# Patient Record
Sex: Female | Born: 1960 | Race: White | Hispanic: No | Marital: Married
Health system: Southern US, Community
[De-identification: ages and names within clinical notes are randomized; demographics above are authoritative.]

---

## 2006-05-17 ENCOUNTER — Encounter: Admission: RE | Admit: 2006-05-17 | Discharge: 2006-05-17 | Payer: Self-pay | Admitting: *Deleted

## 2006-07-12 ENCOUNTER — Ambulatory Visit: Payer: Self-pay | Admitting: Cardiology

## 2007-07-16 ENCOUNTER — Encounter: Admission: RE | Admit: 2007-07-16 | Discharge: 2007-07-16 | Payer: Self-pay | Admitting: Family Medicine

## 2009-06-29 ENCOUNTER — Encounter: Admission: RE | Admit: 2009-06-29 | Discharge: 2009-06-29 | Payer: Self-pay | Admitting: Family Medicine

## 2010-07-31 ENCOUNTER — Other Ambulatory Visit: Payer: Self-pay | Admitting: Family Medicine

## 2010-07-31 DIAGNOSIS — Z1231 Encounter for screening mammogram for malignant neoplasm of breast: Secondary | ICD-10-CM

## 2010-08-25 ENCOUNTER — Ambulatory Visit
Admission: RE | Admit: 2010-08-25 | Discharge: 2010-08-25 | Disposition: A | Discharge status: Home / Self Care | Payer: BC Managed Care – PPO | Source: Ambulatory Visit | Attending: Family Medicine | Admitting: Family Medicine

## 2010-08-25 DIAGNOSIS — Z1231 Encounter for screening mammogram for malignant neoplasm of breast: Secondary | ICD-10-CM

## 2011-12-24 ENCOUNTER — Other Ambulatory Visit: Payer: Self-pay | Admitting: Family Medicine

## 2011-12-24 DIAGNOSIS — Z1231 Encounter for screening mammogram for malignant neoplasm of breast: Secondary | ICD-10-CM

## 2012-01-28 ENCOUNTER — Ambulatory Visit
Admission: RE | Admit: 2012-01-28 | Discharge: 2012-01-28 | Disposition: A | Discharge status: Home / Self Care | Payer: BC Managed Care – PPO | Source: Ambulatory Visit | Attending: Family Medicine | Admitting: Family Medicine

## 2012-01-28 DIAGNOSIS — Z1231 Encounter for screening mammogram for malignant neoplasm of breast: Secondary | ICD-10-CM

## 2012-02-01 ENCOUNTER — Other Ambulatory Visit: Payer: Self-pay | Admitting: Family Medicine

## 2012-02-01 DIAGNOSIS — R928 Other abnormal and inconclusive findings on diagnostic imaging of breast: Secondary | ICD-10-CM

## 2012-02-11 ENCOUNTER — Ambulatory Visit
Admission: RE | Admit: 2012-02-11 | Discharge: 2012-02-11 | Disposition: A | Discharge status: Home / Self Care | Payer: BC Managed Care – PPO | Source: Ambulatory Visit | Attending: Family Medicine | Admitting: Family Medicine

## 2012-02-11 DIAGNOSIS — R928 Other abnormal and inconclusive findings on diagnostic imaging of breast: Secondary | ICD-10-CM

## 2012-03-20 ENCOUNTER — Other Ambulatory Visit: Payer: Self-pay | Admitting: Family Medicine

## 2012-03-20 DIAGNOSIS — N6320 Unspecified lump in the left breast, unspecified quadrant: Secondary | ICD-10-CM

## 2012-03-28 ENCOUNTER — Ambulatory Visit
Admission: RE | Admit: 2012-03-28 | Discharge: 2012-03-28 | Disposition: A | Discharge status: Home / Self Care | Payer: BC Managed Care – PPO | Source: Ambulatory Visit | Attending: Family Medicine | Admitting: Family Medicine

## 2012-03-28 DIAGNOSIS — N6320 Unspecified lump in the left breast, unspecified quadrant: Secondary | ICD-10-CM

## 2012-03-28 HISTORY — PX: BREAST BIOPSY: SHX20

## 2014-01-12 ENCOUNTER — Other Ambulatory Visit: Payer: Self-pay | Admitting: Family Medicine

## 2014-01-12 DIAGNOSIS — N63 Unspecified lump in unspecified breast: Secondary | ICD-10-CM

## 2014-01-27 ENCOUNTER — Ambulatory Visit
Admission: RE | Admit: 2014-01-27 | Discharge: 2014-01-27 | Disposition: A | Discharge status: Home / Self Care | Payer: BC Managed Care – PPO | Source: Ambulatory Visit | Attending: Family Medicine | Admitting: Family Medicine

## 2014-01-27 ENCOUNTER — Other Ambulatory Visit: Payer: Self-pay | Admitting: Family Medicine

## 2014-01-27 DIAGNOSIS — N63 Unspecified lump in unspecified breast: Secondary | ICD-10-CM

## 2014-01-27 DIAGNOSIS — R921 Mammographic calcification found on diagnostic imaging of breast: Secondary | ICD-10-CM

## 2014-02-05 ENCOUNTER — Ambulatory Visit
Admission: RE | Admit: 2014-02-05 | Discharge: 2014-02-05 | Disposition: A | Discharge status: Home / Self Care | Payer: BC Managed Care – PPO | Source: Ambulatory Visit | Attending: Family Medicine | Admitting: Family Medicine

## 2014-02-05 DIAGNOSIS — R921 Mammographic calcification found on diagnostic imaging of breast: Secondary | ICD-10-CM

## 2014-02-05 HISTORY — PX: BREAST BIOPSY: SHX20

## 2014-10-27 IMAGING — US US BREAST*L*
1 series · 13 of 15 positions shown · non-contrast
Comparison: 01/28/2012, 08/25/2010, 06/29/2009.

CLINICAL DATA: Recall from screening mammography.

DIGITAL DIAGNOSTIC LEFT BREAST MAMMOGRAM  AND LEFT BREAST
ULTRASOUND:

[Series 1: us breast*left* · 13 of 15 slices shown]
[im 1/15]
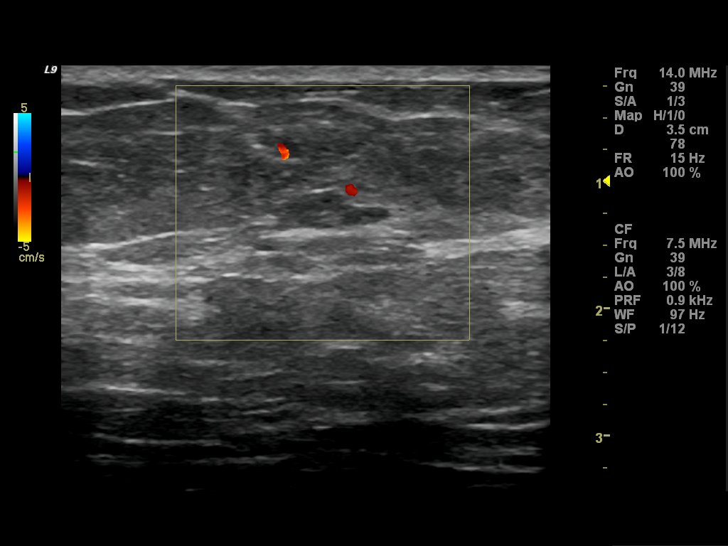
[im 2/15]
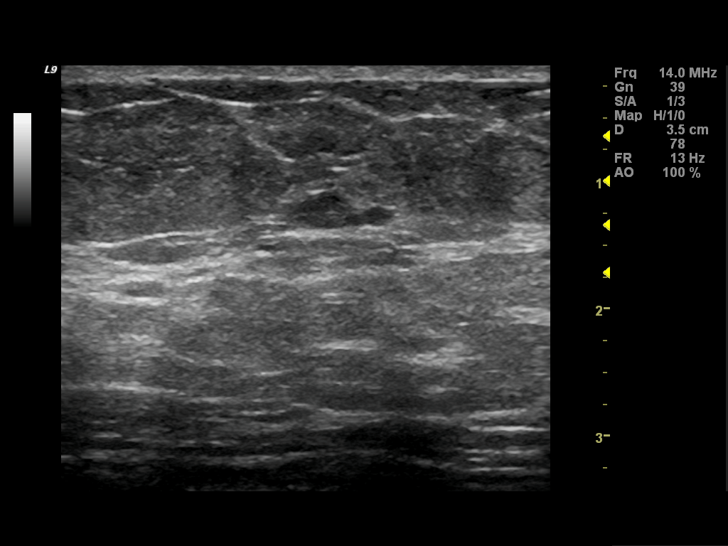
[im 3/15]
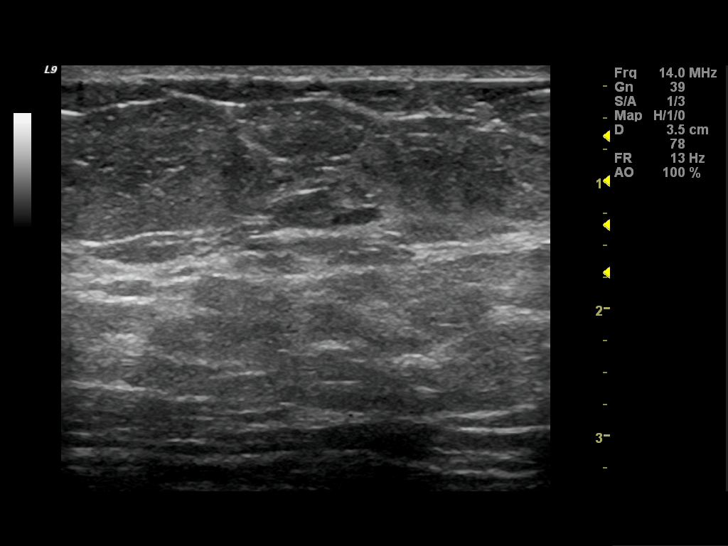
[im 5/15]
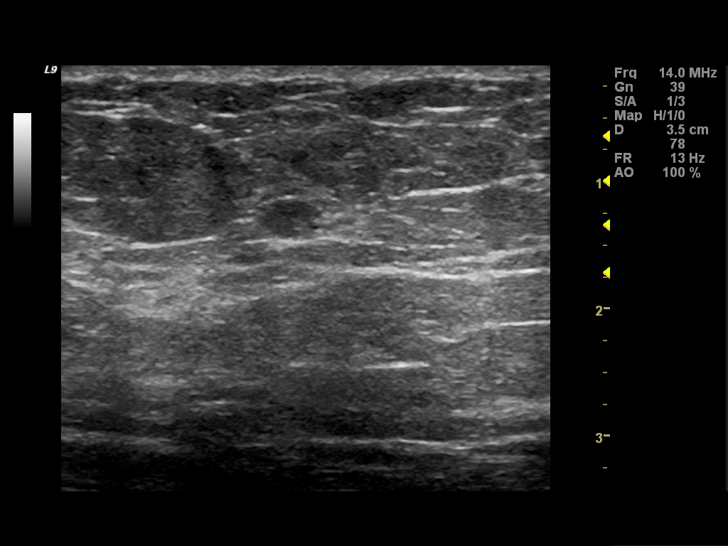
[im 6/15]
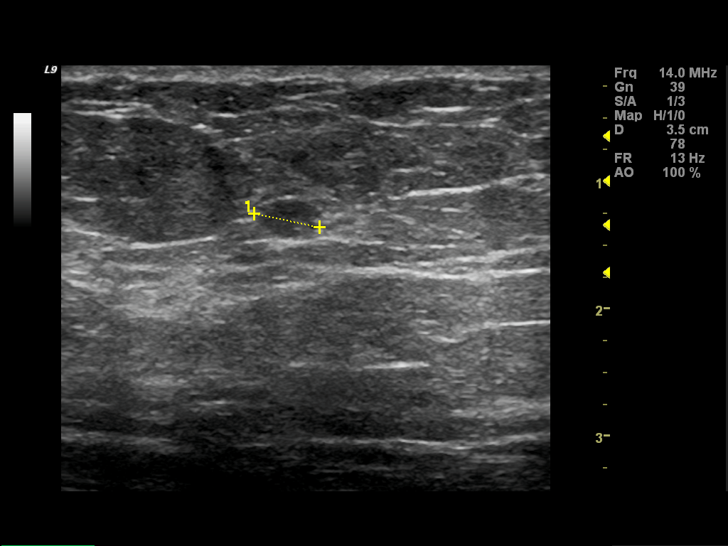
[im 7/15]
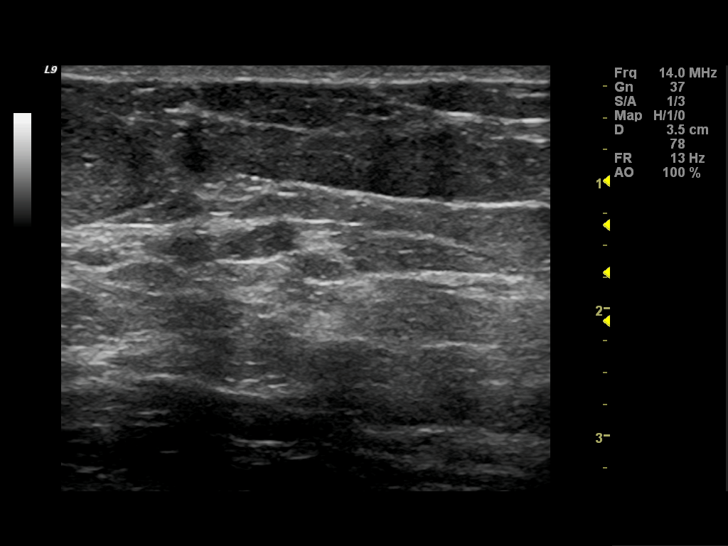
[im 8/15]
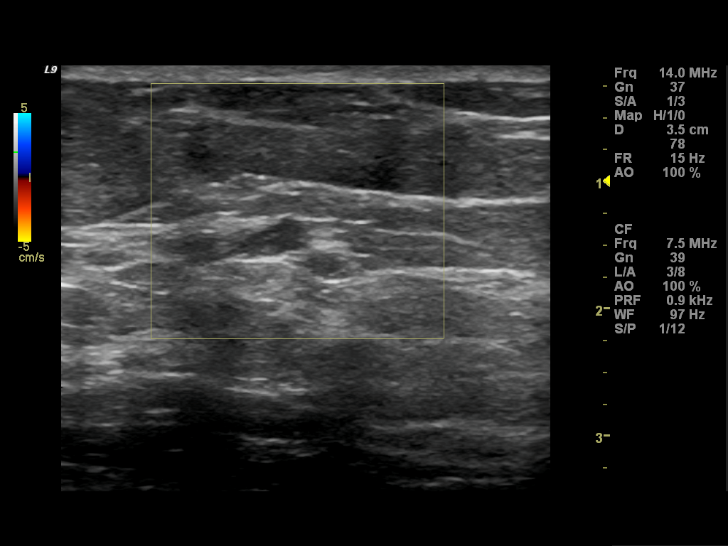
[im 9/15]
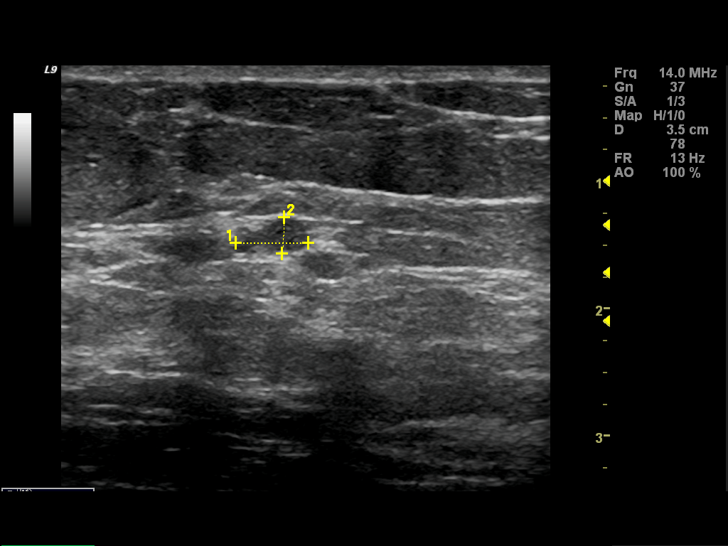
[im 10/15]
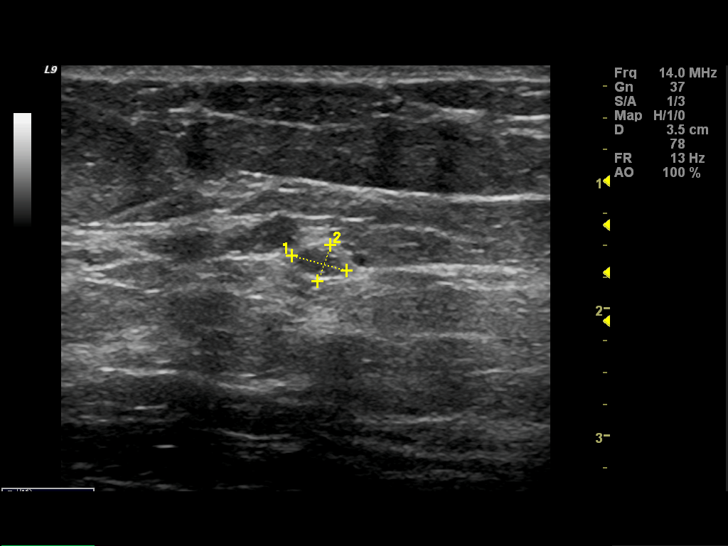
[im 11/15]
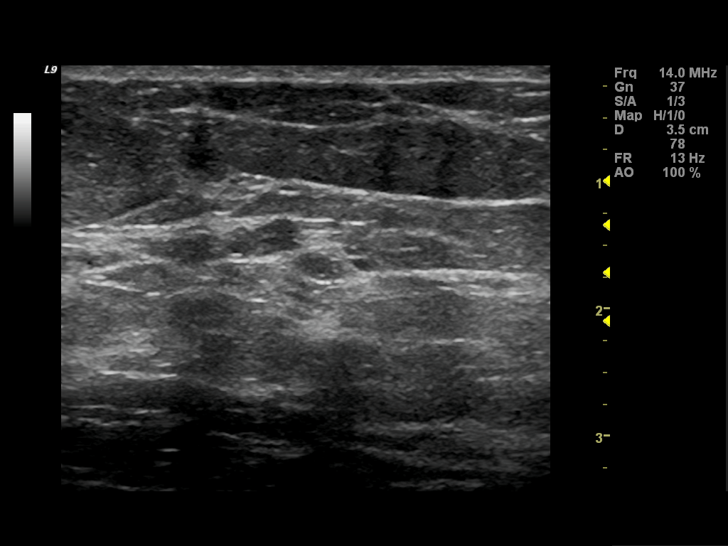
[im 13/15]
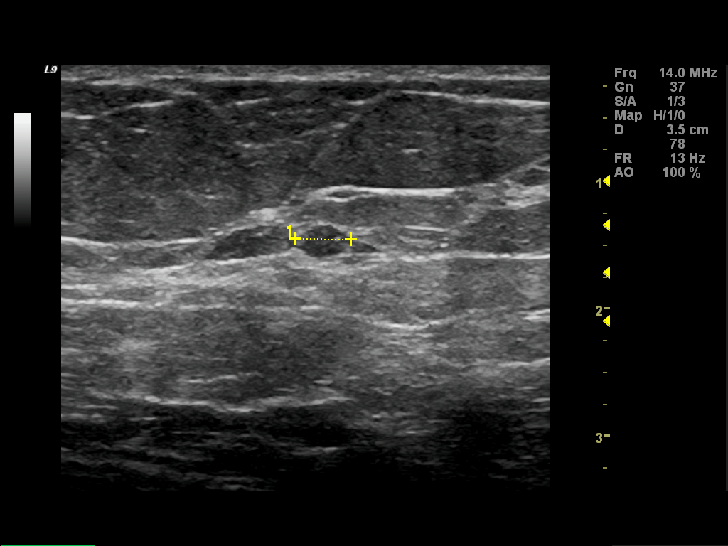
[im 14/15]
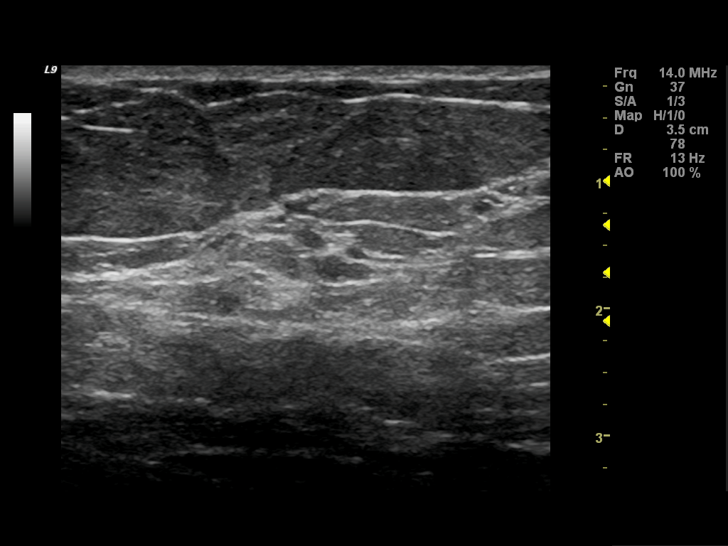
[im 15/15]
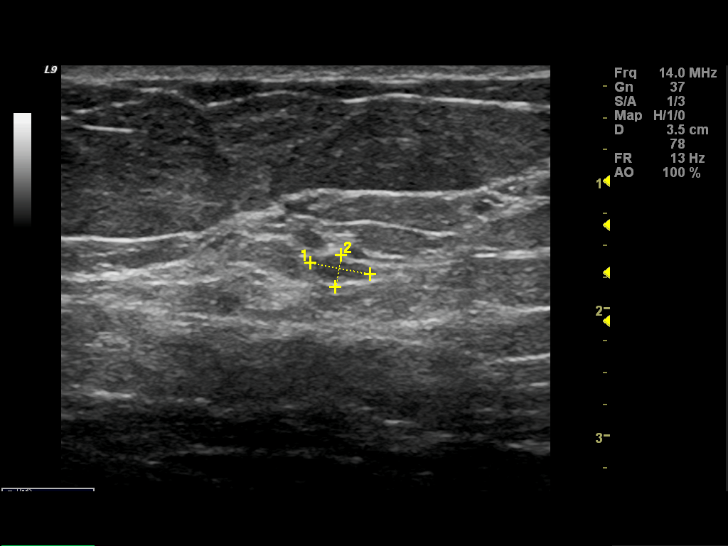

[13 of 15 positions shown; findings below may reference images not displayed]

FINDINGS: Spot compression views of the left breast demonstrate a
partially obscured, oval mass to be present located superiorly
within the left breast at the 1 o'clock position.

On physical exam, there is no discrete palpable abnormality within
the superior left breast or upper-outer quadrant of the left
breast.

Ultrasound is performed, showing an oval, circumscribed, hypoechoic
mass located within the left breast at the 1 o'clock position 7 cm
from the nipple.  This is oriented parallel to the chest wall.
This measures 8 x 5 x 3 mm in size.  This may represent a
fibroadenoma, intramammary lymph node, or possibly papilloma.  This
is felt to represent most likely a benign finding.

Located at the 1 o'clock position 6 cm from the nipple are two
adjacent small, oval, circumscribed hypoechoic masses similar in
appearance to the larger mass.  These measure 5 x 4 x 3 mm in size
and 6 x 4 x 3 mm in size.  These also most likely represent
adjacent fibroadenomas or possibly papillomas.

I have discussed options of six, 12, 24-month follow-up imaging
evaluation versus tissue sampling of the larger mass located at the
1 o'clock position 7 cm from nipple.  The patient prefers imaging
follow-up evaluation.
IMPRESSION: Probably benign masses located within the left breast at the one
o'clock position 6 and 7 cm from the nipple as discussed above.
Recommend follow-up left breast ultrasound and diagnostic mammogram
in 6 months.

RECOMMENDATION:
Left breast ultrasound and diagnostic mammogram in 6 months.

I have discussed the findings and recommendations with the patient.
Results were also provided in writing at the conclusion of the
visit.

BI-RADS CATEGORY 3:  Probably benign finding(s) - short interval
follow-up suggested.

## 2015-04-04 ENCOUNTER — Other Ambulatory Visit: Payer: Self-pay

## 2015-04-04 DIAGNOSIS — Z1231 Encounter for screening mammogram for malignant neoplasm of breast: Secondary | ICD-10-CM

## 2015-04-19 ENCOUNTER — Ambulatory Visit
Admission: RE | Admit: 2015-04-19 | Discharge: 2015-04-19 | Disposition: A | Discharge status: Home / Self Care | Payer: BC Managed Care – PPO | Source: Ambulatory Visit

## 2015-04-19 DIAGNOSIS — Z1231 Encounter for screening mammogram for malignant neoplasm of breast: Secondary | ICD-10-CM

## 2016-09-25 ENCOUNTER — Other Ambulatory Visit: Payer: Self-pay | Admitting: Family Medicine

## 2016-09-25 DIAGNOSIS — Z1231 Encounter for screening mammogram for malignant neoplasm of breast: Secondary | ICD-10-CM

## 2016-10-05 ENCOUNTER — Ambulatory Visit: Payer: BC Managed Care – PPO

## 2017-03-14 ENCOUNTER — Ambulatory Visit
Admission: RE | Admit: 2017-03-14 | Discharge: 2017-03-14 | Disposition: A | Discharge status: Home / Self Care | Payer: BC Managed Care – PPO | Source: Ambulatory Visit | Attending: Family Medicine | Admitting: Family Medicine

## 2017-03-14 DIAGNOSIS — Z1231 Encounter for screening mammogram for malignant neoplasm of breast: Secondary | ICD-10-CM

## 2019-04-17 ENCOUNTER — Other Ambulatory Visit: Payer: Self-pay | Admitting: Family Medicine

## 2019-04-17 DIAGNOSIS — Z1231 Encounter for screening mammogram for malignant neoplasm of breast: Secondary | ICD-10-CM

## 2019-05-22 ENCOUNTER — Other Ambulatory Visit: Payer: Self-pay

## 2019-05-22 ENCOUNTER — Ambulatory Visit
Admission: RE | Admit: 2019-05-22 | Discharge: 2019-05-22 | Disposition: A | Discharge status: Home / Self Care | Payer: BC Managed Care – PPO | Source: Ambulatory Visit | Attending: Family Medicine | Admitting: Family Medicine

## 2019-05-22 DIAGNOSIS — Z1231 Encounter for screening mammogram for malignant neoplasm of breast: Secondary | ICD-10-CM

## 2019-05-24 ENCOUNTER — Ambulatory Visit: Payer: BC Managed Care – PPO | Attending: Internal Medicine

## 2019-05-24 DIAGNOSIS — Z23 Encounter for immunization: Secondary | ICD-10-CM | POA: Insufficient documentation

## 2019-05-24 NOTE — Progress Notes (Signed)
   Covid-19 Vaccination Clinic  Name:  Taylor Campos    MRN: [id number] DOB: [birth year]  05/24/2019  [name] was observed post Covid-19 immunization for 15 minutes without incident. She was provided with Vaccine Information Sheet and instruction to access the V-Safe system.   [name] was instructed to call 911 with any severe reactions post vaccine: Marland Kitchen Difficulty breathing  . Swelling of face and throat  . A fast heartbeat  . A bad rash all over body  . Dizziness and weakness   Immunizations Administered    Name Date Dose VIS Date Route   Pfizer COVID-19 Vaccine 05/24/2019  5:31 PM 0.3 mL 02/27/2019 Intramuscular   Manufacturer: [employer], [employer]   Lot: [id number]   NDC: [address]-6

## 2019-05-26 ENCOUNTER — Ambulatory Visit: Payer: BC Managed Care – PPO

## 2019-06-14 ENCOUNTER — Ambulatory Visit: Payer: BC Managed Care – PPO | Attending: Internal Medicine

## 2019-06-14 DIAGNOSIS — Z23 Encounter for immunization: Secondary | ICD-10-CM

## 2019-06-14 NOTE — Progress Notes (Signed)
   Covid-19 Vaccination Clinic  Name:  Taylor Campos    MRN: [id number] DOB: [birth year]  06/14/2019  [name] was observed post Covid-19 immunization for 15 minutes without incident. She was provided with Vaccine Information Sheet and instruction to access the V-Safe system.   [name] was instructed to call 911 with any severe reactions post vaccine: Marland Kitchen Difficulty breathing  . Swelling of face and throat  . A fast heartbeat  . A bad rash all over body  . Dizziness and weakness   Immunizations Administered    Name Date Dose VIS Date Route   Pfizer COVID-19 Vaccine 06/14/2019  3:13 PM 0.3 mL 02/27/2019 Intramuscular   Manufacturer: [employer], [employer]   Lot: [id number]   NDC: [address]-3

## 2020-11-01 ENCOUNTER — Other Ambulatory Visit: Payer: Self-pay | Admitting: Family Medicine

## 2020-11-01 DIAGNOSIS — Z1231 Encounter for screening mammogram for malignant neoplasm of breast: Secondary | ICD-10-CM

## 2020-11-11 ENCOUNTER — Ambulatory Visit
Admission: RE | Admit: 2020-11-11 | Discharge: 2020-11-11 | Disposition: A | Discharge status: Home / Self Care | Payer: BC Managed Care – PPO | Source: Ambulatory Visit | Attending: Family Medicine | Admitting: Family Medicine

## 2020-11-11 ENCOUNTER — Other Ambulatory Visit: Payer: Self-pay

## 2020-11-11 DIAGNOSIS — Z1231 Encounter for screening mammogram for malignant neoplasm of breast: Secondary | ICD-10-CM

## 2021-12-21 ENCOUNTER — Other Ambulatory Visit: Payer: Self-pay | Admitting: Family Medicine

## 2021-12-21 DIAGNOSIS — Z1231 Encounter for screening mammogram for malignant neoplasm of breast: Secondary | ICD-10-CM

## 2022-01-24 ENCOUNTER — Ambulatory Visit: Payer: BC Managed Care – PPO

## 2022-02-20 ENCOUNTER — Ambulatory Visit: Payer: BC Managed Care – PPO

## 2022-02-27 ENCOUNTER — Ambulatory Visit
Admission: RE | Admit: 2022-02-27 | Discharge: 2022-02-27 | Disposition: A | Discharge status: Home / Self Care | Payer: BC Managed Care – PPO | Source: Ambulatory Visit | Attending: Family Medicine | Admitting: Family Medicine

## 2022-02-27 DIAGNOSIS — Z1231 Encounter for screening mammogram for malignant neoplasm of breast: Secondary | ICD-10-CM

## 2022-04-12 ENCOUNTER — Ambulatory Visit: Payer: BC Managed Care – PPO

## 2023-06-27 ENCOUNTER — Other Ambulatory Visit: Payer: Self-pay | Admitting: Family Medicine

## 2023-06-27 DIAGNOSIS — Z1231 Encounter for screening mammogram for malignant neoplasm of breast: Secondary | ICD-10-CM

## 2023-07-10 ENCOUNTER — Ambulatory Visit
Admission: RE | Admit: 2023-07-10 | Discharge: 2023-07-10 | Disposition: A | Discharge status: Home / Self Care | Payer: Self-pay | Source: Ambulatory Visit | Attending: Family Medicine | Admitting: Family Medicine

## 2023-07-10 ENCOUNTER — Encounter: Payer: Self-pay | Admitting: Radiology

## 2023-07-10 DIAGNOSIS — Z1231 Encounter for screening mammogram for malignant neoplasm of breast: Secondary | ICD-10-CM

## 2023-07-28 IMAGING — MG MM DIGITAL SCREENING BILAT W/ TOMO AND CAD
8 series · 8 of 24 positions shown · non-contrast
Comparison: Previous exam(s).

CLINICAL DATA: Screening.

EXAM:
DIGITAL SCREENING BILATERAL MAMMOGRAM WITH TOMOSYNTHESIS AND CAD
TECHNIQUE: Bilateral screening digital craniocaudal and mediolateral oblique
mammograms were obtained. Bilateral screening digital breast
tomosynthesis was performed. The images were evaluated with
computer-aided detection.

[R CC synth-2D]
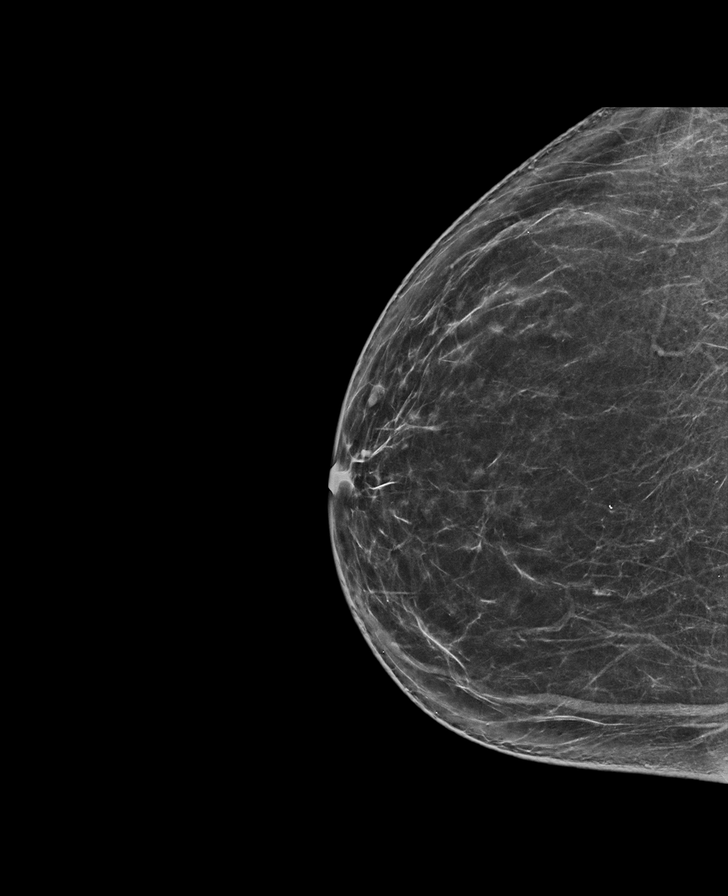

[L CC synth-2D]
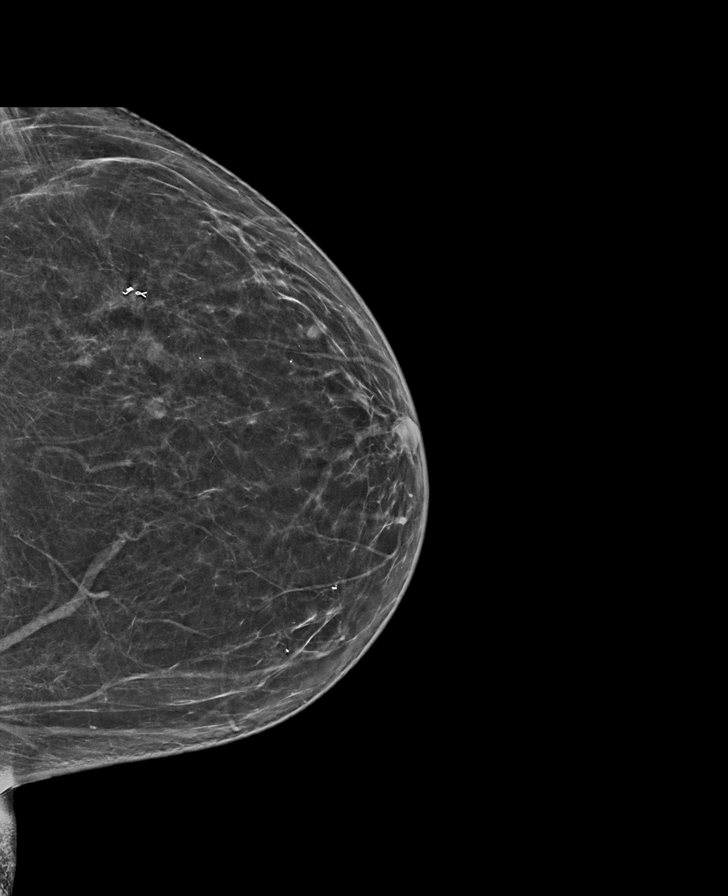

[L MLO synth-2D]
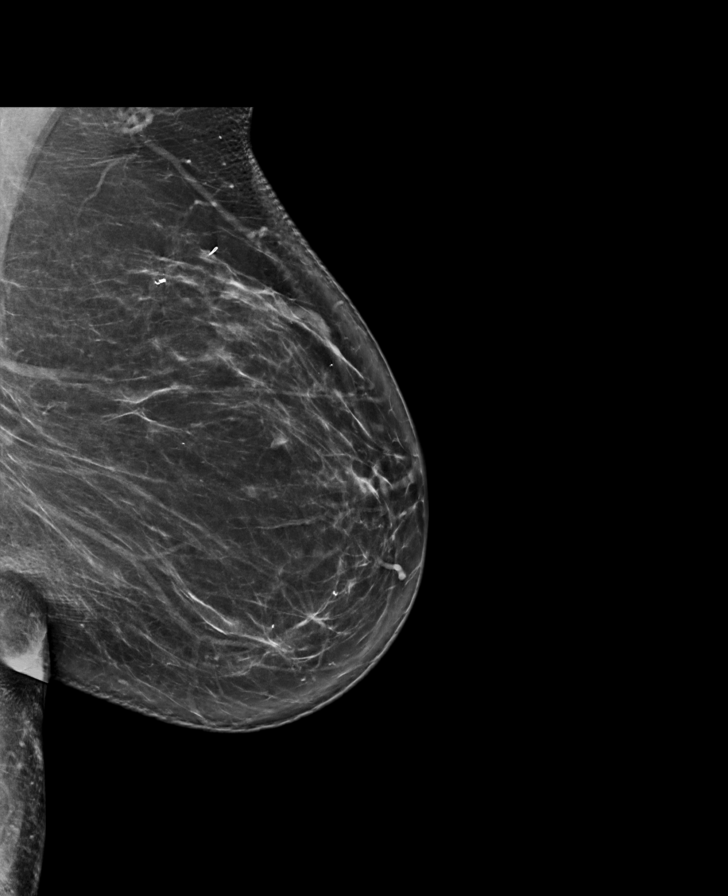

[R MLO synth-2D]
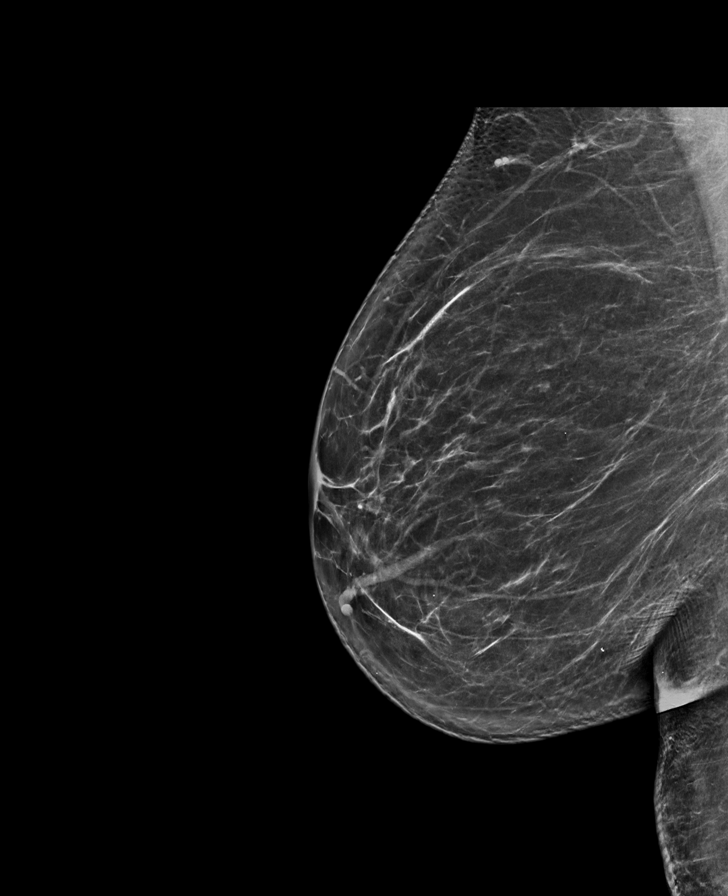

[L CC tomo · tomo slice 36/71.0]
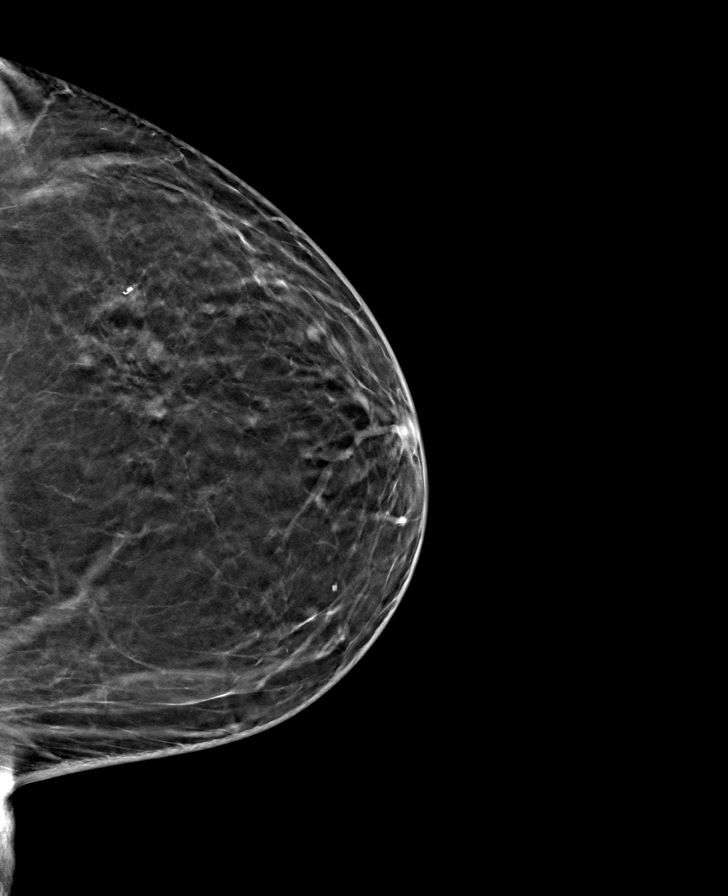

[R CC tomo · tomo slice 38/75.0]
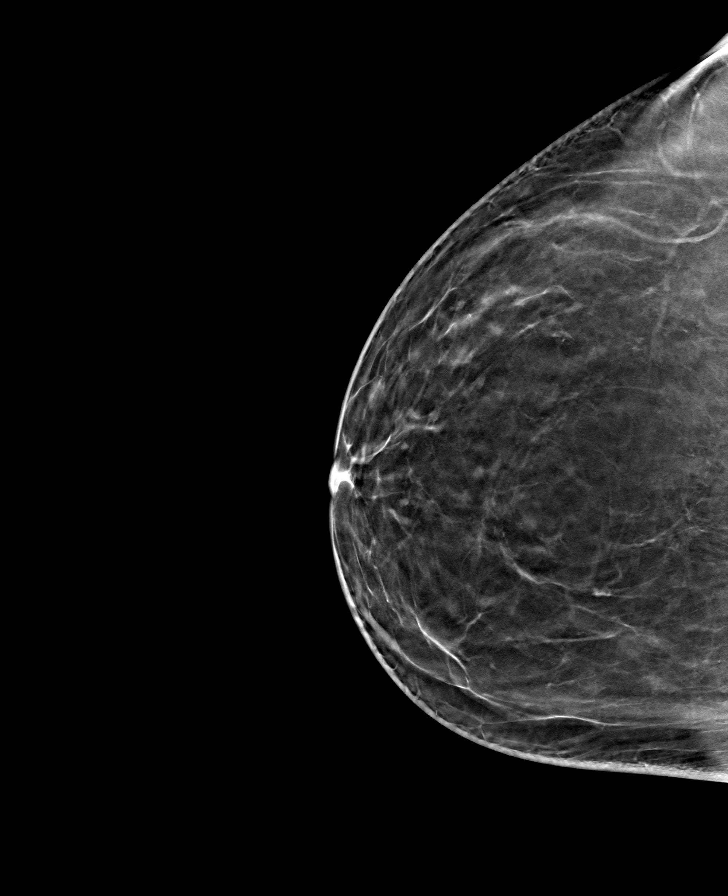

[R MLO tomo · tomo slice 42/83.0]
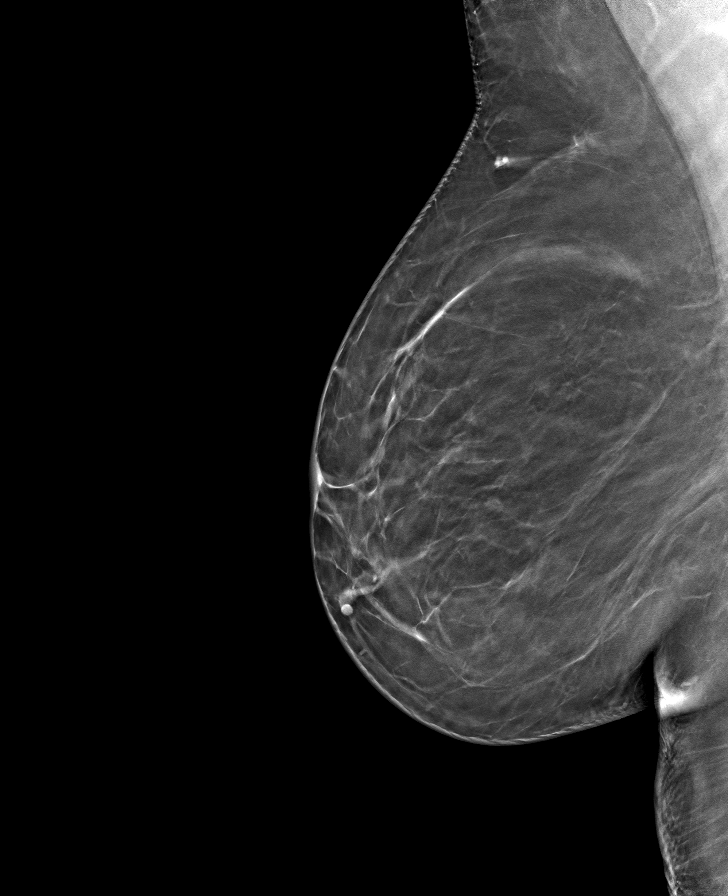

[L MLO tomo · tomo slice 42/83.0]
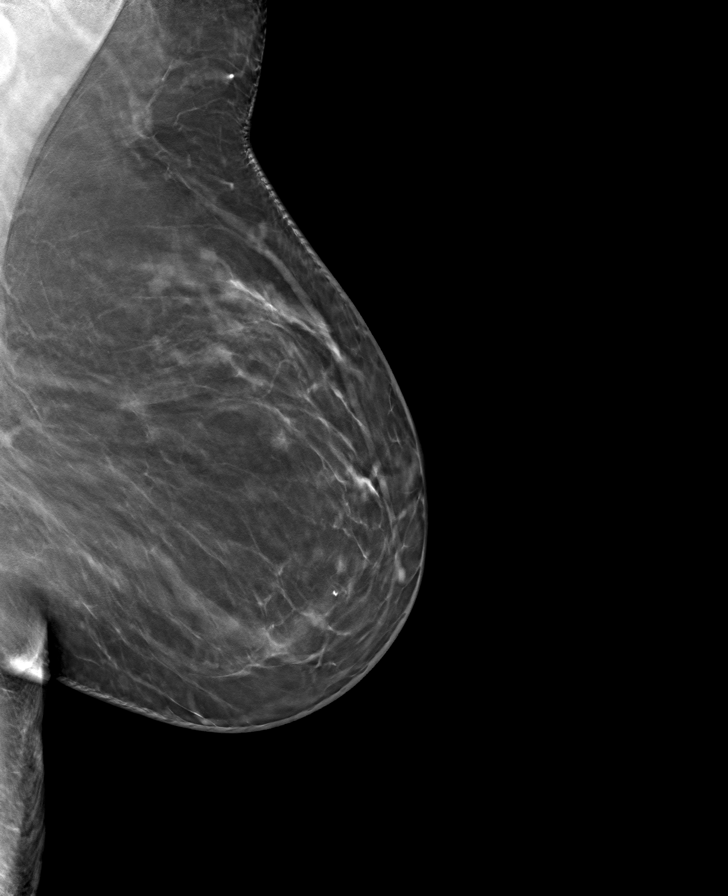

[8 of 24 positions shown; findings below may reference images not displayed]

ACR Breast Density Category b: There are scattered areas of
fibroglandular density.
FINDINGS: There are no findings suspicious for malignancy.
IMPRESSION: No mammographic evidence of malignancy. A result letter of this
screening mammogram will be mailed directly to the patient.

RECOMMENDATION:
Screening mammogram in one year. (Code:51-O-LD2)

BI-RADS CATEGORY  1: Negative.
